# Patient Record
Sex: Female | Born: 1980 | Race: White | Hispanic: No | Marital: Single | Smoking: Current every day smoker
Health system: Southern US, Community
[De-identification: ages and names within clinical notes are randomized; demographics above are authoritative.]

## PROBLEM LIST (undated history)

## (undated) DIAGNOSIS — K5190 Ulcerative colitis, unspecified, without complications: Secondary | ICD-10-CM

## (undated) DIAGNOSIS — G4700 Insomnia, unspecified: Secondary | ICD-10-CM

## (undated) DIAGNOSIS — A0472 Enterocolitis due to Clostridium difficile, not specified as recurrent: Secondary | ICD-10-CM

## (undated) DIAGNOSIS — F319 Bipolar disorder, unspecified: Secondary | ICD-10-CM

## (undated) HISTORY — PX: BREAST ENHANCEMENT SURGERY: SHX7

## (undated) HISTORY — PX: DILATION AND CURETTAGE OF UTERUS: SHX78

---

## 2018-10-15 ENCOUNTER — Emergency Department (HOSPITAL_COMMUNITY): Payer: Self-pay

## 2018-10-15 ENCOUNTER — Other Ambulatory Visit: Payer: Self-pay

## 2018-10-15 ENCOUNTER — Observation Stay (HOSPITAL_COMMUNITY)
Admission: EM | Admit: 2018-10-15 | Discharge: 2018-10-15 | Disposition: A | Discharge status: Home / Self Care | Payer: Self-pay | Attending: Internal Medicine | Admitting: Internal Medicine

## 2018-10-15 ENCOUNTER — Encounter (HOSPITAL_COMMUNITY): Payer: Self-pay | Admitting: Emergency Medicine

## 2018-10-15 DIAGNOSIS — E860 Dehydration: Secondary | ICD-10-CM

## 2018-10-15 DIAGNOSIS — K5190 Ulcerative colitis, unspecified, without complications: Secondary | ICD-10-CM | POA: Insufficient documentation

## 2018-10-15 DIAGNOSIS — F4310 Post-traumatic stress disorder, unspecified: Secondary | ICD-10-CM | POA: Insufficient documentation

## 2018-10-15 DIAGNOSIS — F17210 Nicotine dependence, cigarettes, uncomplicated: Secondary | ICD-10-CM | POA: Insufficient documentation

## 2018-10-15 DIAGNOSIS — A0819 Acute gastroenteropathy due to other small round viruses: Principal | ICD-10-CM | POA: Insufficient documentation

## 2018-10-15 DIAGNOSIS — F411 Generalized anxiety disorder: Secondary | ICD-10-CM | POA: Insufficient documentation

## 2018-10-15 DIAGNOSIS — R059 Cough, unspecified: Secondary | ICD-10-CM

## 2018-10-15 DIAGNOSIS — R05 Cough: Secondary | ICD-10-CM

## 2018-10-15 DIAGNOSIS — F515 Nightmare disorder: Secondary | ICD-10-CM

## 2018-10-15 DIAGNOSIS — A0472 Enterocolitis due to Clostridium difficile, not specified as recurrent: Secondary | ICD-10-CM | POA: Insufficient documentation

## 2018-10-15 DIAGNOSIS — R000 Tachycardia, unspecified: Secondary | ICD-10-CM | POA: Insufficient documentation

## 2018-10-15 DIAGNOSIS — F319 Bipolar disorder, unspecified: Secondary | ICD-10-CM | POA: Insufficient documentation

## 2018-10-15 DIAGNOSIS — R112 Nausea with vomiting, unspecified: Secondary | ICD-10-CM

## 2018-10-15 DIAGNOSIS — R197 Diarrhea, unspecified: Secondary | ICD-10-CM | POA: Diagnosis present

## 2018-10-15 DIAGNOSIS — A0811 Acute gastroenteropathy due to Norwalk agent: Secondary | ICD-10-CM

## 2018-10-15 DIAGNOSIS — Z79899 Other long term (current) drug therapy: Secondary | ICD-10-CM

## 2018-10-15 HISTORY — DX: Enterocolitis due to Clostridium difficile, not specified as recurrent: A04.72

## 2018-10-15 HISTORY — DX: Ulcerative colitis, unspecified, without complications: K51.90

## 2018-10-15 LAB — COMPREHENSIVE METABOLIC PANEL
ALT: 18 U/L (ref 0–44)
AST: 19 U/L (ref 15–41)
Albumin: 3.9 g/dL (ref 3.5–5.0)
Alkaline Phosphatase: 68 U/L (ref 38–126)
Anion gap: 10 (ref 5–15)
BUN: 15 mg/dL (ref 6–20)
CO2: 20 mmol/L — ABNORMAL LOW (ref 22–32)
CREATININE: 0.81 mg/dL (ref 0.44–1.00)
Calcium: 9 mg/dL (ref 8.9–10.3)
Chloride: 110 mmol/L (ref 98–111)
GFR calc Af Amer: >60 mL/min (ref >60)
GFR calc non Af Amer: >60 mL/min (ref >60)
Glucose, Bld: 130 mg/dL — ABNORMAL HIGH (ref 70–99)
POTASSIUM: 4.2 mmol/L (ref 3.5–5.1)
Sodium: 140 mmol/L (ref 135–145)
Total Bilirubin: 0.6 mg/dL (ref 0.3–1.2)
Total Protein: 7.3 g/dL (ref 6.5–8.1)

## 2018-10-15 LAB — GASTROINTESTINAL PANEL BY PCR, STOOL (REPLACES STOOL CULTURE)
ADENOVIRUS F40/41: NOT DETECTED
Astrovirus: NOT DETECTED
CRYPTOSPORIDIUM: NOT DETECTED
Campylobacter species: NOT DETECTED
Cyclospora cayetanensis: NOT DETECTED
Entamoeba histolytica: NOT DETECTED
Enteroaggregative E coli (EAEC): NOT DETECTED
Enteropathogenic E coli (EPEC): NOT DETECTED
Enterotoxigenic E coli (ETEC): NOT DETECTED
Giardia lamblia: NOT DETECTED
Norovirus GI/GII: DETECTED — AB
Plesimonas shigelloides: NOT DETECTED
Rotavirus A: NOT DETECTED
Salmonella species: NOT DETECTED
Sapovirus (I, II, IV, and V): NOT DETECTED
Shiga like toxin producing E coli (STEC): NOT DETECTED
Shigella/Enteroinvasive E coli (EIEC): NOT DETECTED
Vibrio cholerae: NOT DETECTED
Vibrio species: NOT DETECTED
YERSINIA ENTEROCOLITICA: NOT DETECTED

## 2018-10-15 LAB — CBC
HCT: 46.7 % — ABNORMAL HIGH (ref 36.0–46.0)
Hemoglobin: 14.9 g/dL (ref 12.0–15.0)
MCH: 28.6 pg (ref 26.0–34.0)
MCHC: 31.9 g/dL (ref 30.0–36.0)
MCV: 89.6 fL (ref 80.0–100.0)
Platelets: 355 10*3/uL (ref 150–400)
RBC: 5.21 MIL/uL — ABNORMAL HIGH (ref 3.87–5.11)
RDW: 12 % (ref 11.5–15.5)
WBC: 17.1 10*3/uL — ABNORMAL HIGH (ref 4.0–10.5)
nRBC: 0 % (ref 0.0–0.2)

## 2018-10-15 LAB — C DIFFICILE QUICK SCREEN W PCR REFLEX
C Diff antigen: NEGATIVE
C Diff interpretation: NOT DETECTED
C Diff toxin: NEGATIVE

## 2018-10-15 LAB — URINALYSIS, ROUTINE W REFLEX MICROSCOPIC
Bilirubin Urine: NEGATIVE
Glucose, UA: NEGATIVE mg/dL
Hgb urine dipstick: NEGATIVE
Ketones, ur: NEGATIVE mg/dL
Leukocytes, UA: NEGATIVE
NITRITE: NEGATIVE
Protein, ur: NEGATIVE mg/dL
Specific Gravity, Urine: 1.023 (ref 1.005–1.030)
pH: 6 (ref 5.0–8.0)

## 2018-10-15 LAB — LIPASE, BLOOD: Lipase: 28 U/L (ref 11–51)

## 2018-10-15 LAB — I-STAT BETA HCG BLOOD, ED (MC, WL, AP ONLY): I-stat hCG, quantitative: <5 m[IU]/mL (ref <5)

## 2018-10-15 MED ORDER — IBUPROFEN 800 MG PO TABS
800.0000 mg | ORAL_TABLET | Freq: Four times a day (QID) | ORAL | Status: DC | PRN
  Administered 2018-10-15: 800 mg via ORAL
  Filled 2018-10-15: qty 1

## 2018-10-15 MED ORDER — LURASIDONE HCL 40 MG PO TABS
80.0000 mg | ORAL_TABLET | Freq: Every day | ORAL | Status: DC
  Filled 2018-10-15: qty 2

## 2018-10-15 MED ORDER — MORPHINE SULFATE (PF) 4 MG/ML IV SOLN
4.0000 mg | Freq: Once | INTRAVENOUS | Status: AC
  Administered 2018-10-15: 4 mg via INTRAVENOUS
  Filled 2018-10-15: qty 1

## 2018-10-15 MED ORDER — BUSPIRONE HCL 10 MG PO TABS
20.0000 mg | ORAL_TABLET | Freq: Three times a day (TID) | ORAL | Status: DC
  Administered 2018-10-15: 20 mg via ORAL
  Filled 2018-10-15: qty 2

## 2018-10-15 MED ORDER — ONDANSETRON 4 MG PO TBDP
4.0000 mg | ORAL_TABLET | Freq: Once | ORAL | Status: AC
  Administered 2018-10-15: 4 mg via ORAL
  Filled 2018-10-15: qty 1

## 2018-10-15 MED ORDER — ENOXAPARIN SODIUM 40 MG/0.4ML ~~LOC~~ SOLN
40.0000 mg | SUBCUTANEOUS | Status: DC
  Filled 2018-10-15: qty 0.4

## 2018-10-15 MED ORDER — ACETAMINOPHEN 500 MG PO TABS: 1000.0000 mg | ORAL_TABLET | Freq: Four times a day (QID) | ORAL | Status: DC | PRN

## 2018-10-15 MED ORDER — PRAZOSIN HCL 2 MG PO CAPS: 5.0000 mg | ORAL_CAPSULE | Freq: Every day | ORAL | Status: DC

## 2018-10-15 MED ORDER — IOHEXOL 300 MG/ML  SOLN: 30.0000 mL | Freq: Once | INTRAMUSCULAR | Status: DC | PRN

## 2018-10-15 MED ORDER — SODIUM CHLORIDE 0.9 % IV BOLUS
1000.0000 mL | Freq: Once | INTRAVENOUS | Status: AC
  Administered 2018-10-15: 1000 mL via INTRAVENOUS

## 2018-10-15 MED ORDER — IOHEXOL 300 MG/ML  SOLN
100.0000 mL | Freq: Once | INTRAMUSCULAR | Status: AC | PRN
  Administered 2018-10-15: 100 mL via INTRAVENOUS

## 2018-10-15 MED ORDER — LAMOTRIGINE 100 MG PO TABS
200.0000 mg | ORAL_TABLET | Freq: Every day | ORAL | Status: DC
  Administered 2018-10-15: 200 mg via ORAL
  Filled 2018-10-15: qty 2

## 2018-10-15 MED ORDER — ONDANSETRON 4 MG PO TBDP: 4.0000 mg | ORAL_TABLET | Freq: Three times a day (TID) | ORAL | 0 refills | Status: DC | PRN

## 2018-10-15 MED ORDER — ONDANSETRON 4 MG PO TBDP: 4.0000 mg | ORAL_TABLET | Freq: Three times a day (TID) | ORAL | Status: DC | PRN

## 2018-10-15 NOTE — ED Provider Notes (Signed)
[facility] EMERGENCY DEPARTMENT Provider Note   CSN: [id number] Arrival date & time: 10/15/18  0100     History   Chief Complaint Chief Complaint  Patient presents with  . Emesis  . Diarrhea    HPI Rebekah Cunningham is a 37 y.o. female.  Patient with a history of UC, C diff, presents with onset tonight of nonbloody diarrhea x 7 episodes, and vomiting 7+ episodes tonight. She reports intermittent abdominal pain. She has felt flushed but denies fever. No hematemesis. She states the symptoms feel like previous Cdiff episodes. No ulcerative colitis symptoms in 5 years since receiving "chemo and Remicade" in [place] prior to moving to [place].   The history is provided by the patient. No language interpreter was used.  Emesis   Associated symptoms include abdominal pain, chills and diarrhea. Pertinent negatives include no fever.  Diarrhea   Associated symptoms include abdominal pain, vomiting and chills.    Past Medical History:  Diagnosis Date  . C. difficile colitis   . Ulcerative colitis (HCC)     There are no active problems to display for this patient.   History reviewed. No pertinent surgical history.   OB History   No obstetric history on file.      Home Medications    Prior to Admission medications   Not on File    Family History No family history on file.  Social History Social History   Tobacco Use  . Smoking status: Current Every Day Smoker  . Smokeless tobacco: Never Used  Substance Use Topics  . Alcohol use: Yes  . Drug use: Never     Allergies   Patient has no known allergies.   Review of Systems Review of Systems  Constitutional: Positive for chills. Negative for fever.  Respiratory: Negative.  Negative for shortness of breath.   Cardiovascular: Negative.  Negative for chest pain.  Gastrointestinal: Positive for abdominal pain, diarrhea and vomiting.  Genitourinary: Negative for dysuria.  Musculoskeletal: Negative.    Skin: Negative.   Neurological: Negative.      Physical Exam Updated Vital Signs BP 123/88 (BP Location: Left Arm)   Pulse (!) 113   Temp 97.8 F (36.6 C) (Oral)   Resp 18   SpO2 97%   Physical Exam Constitutional:      General: She is not in acute distress.    Appearance: She is well-developed.  HENT:     Head: Normocephalic.  Neck:     Musculoskeletal: Normal range of motion and neck supple.  Cardiovascular:     Rate and Rhythm: Normal rate and regular rhythm.     Heart sounds: No murmur.  Pulmonary:     Effort: Pulmonary effort is normal.     Breath sounds: Normal breath sounds. No wheezing, rhonchi or rales.  Abdominal:     General: Bowel sounds are normal.     Palpations: Abdomen is soft.     Tenderness: There is no abdominal tenderness. There is no guarding or rebound.  Musculoskeletal: Normal range of motion.  Skin:    General: Skin is warm and dry.     Findings: No rash.  Neurological:     Mental Status: She is alert and oriented to person, place, and time.      ED Treatments / Results  Labs (all labs ordered are listed, but only abnormal results are displayed) Labs Reviewed  COMPREHENSIVE METABOLIC PANEL - Abnormal; Notable for the following components:      Result Value  CO2 20 (*)    Glucose, Bld 130 (*)    All other components within normal limits  CBC - Abnormal; Notable for the following components:   WBC 17.1 (*)    RBC 5.21 (*)    HCT 46.7 (*)    All other components within normal limits  C DIFFICILE QUICK SCREEN W PCR REFLEX  GASTROINTESTINAL PANEL BY PCR, STOOL (REPLACES STOOL CULTURE)  LIPASE, BLOOD  URINALYSIS, ROUTINE W REFLEX MICROSCOPIC  I-STAT BETA HCG BLOOD, ED ([facility], [facility], [facility] ONLY)    EKG None  Radiology No results found.  Procedures Procedures (including critical care time)  Medications Ordered in ED Medications - No data to display   Initial Impression / Assessment and Plan / ED Course  I have reviewed the  triage vital signs and the nursing notes.  Pertinent labs & imaging results that were available during my care of the patient were reviewed by me and considered in my medical decision making (see chart for details).     Patient to ED with intermittent abdominal pain, watery/nonbloody diarrhea, vomiting that started last night. No fever, however, she reports chills. History of C diff with similar symptoms. Also h/o UC without flare in 5 years.   On exam, she has a benign abdomen without tenderness. Obese, soft. She has a leukocytosis of 17, prompting CT scan evaluation which shows:  1. Fluid-filled tubular structure in the right lower quadrant arising from the cecum measures 3.1 cm in diameter. Mild associated wall thickening and enhancement, but only minimal edema tracking centrally. 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 Meckel's diverticulum is also considered but felt less likely. Recommend surgical consultation.  This was discussed with Dr. Donell BeersByerly, surgeon, who advised that the day team would be in to see the patient and provide consultation and recommendation.   Patient care signed out to Huntley DecAbi Harris, PA-C, pending surgical consult.   Final Clinical Impressions(s) / ED Diagnoses   Final diagnoses:  None   1. Diarrhea 2. Nausea and vomiting 3. Abdominal pain  ED Discharge Orders    None       Elpidio AnisUpstill, Yutaka Holberg, PA-C 10/15/18 0645    Glynn Octaveancour, Stephen, MD 10/15/18 [phone]

## 2018-10-15 NOTE — Discharge Summary (Signed)
Name: Rebekah Cunningham MRN: [id number] DOB: 1981-03-16 37 y.o. PCP: No primary care provider on file.  Date of Admission: 10/15/2018  1:00 AM Date of Discharge:  Attending Physician: Inez CatalinaMullen, Emily B, MD  Discharge Diagnosis: 1. Norovirus   Discharge Medications: Allergies as of 10/15/2018   No Known Allergies     Medication List    TAKE these medications   busPIRone 10 MG tablet Commonly known as:  BUSPAR Take 20 mg by mouth 3 (three) times daily.   lamoTRIgine 200 MG tablet Commonly known as:  LAMICTAL Take 200 mg by mouth daily.   lurasidone 80 MG Tabs tablet Commonly known as:  LATUDA Take 80 mg by mouth daily with breakfast.   MINIPRESS 5 MG capsule Generic drug:  prazosin Take 5 mg by mouth at bedtime.   ondansetron 4 MG disintegrating tablet Commonly known as:  ZOFRAN-ODT Take 1 tablet (4 mg total) by mouth every 8 (eight) hours as needed for nausea or vomiting.       Disposition and follow-up:   [name] [facility] St[facility].  At the hospital follow up visit please address:  1.  Norovirus - please confirm symptoms and leukocytosis have resolved   2.  Labs / imaging needed at time of follow-up: CBC with diff  3.  Pending labs/ test needing follow-up: none  Follow-up Appointments: Follow-up Information    [facility] Follow up in 1 week(s).   Contact information: [address] [place] [id number] [phone]          Hospital Course by problem list: 1. Norovirus  Ms. Rebekah Cunningham presented with one day of watery diarrhea and vomiting GI pathogen panel testing revealed norovirus and CBC was remarkable for a leukocyte count of 17. She described her son being sick with a fever and being sent home from school on the day of her presentation. CT scan of the abdomen revealed an inflammed appearance of the appendix and colon, general surgery was  consulted and felt her presentation was not consistent with the need for appendectomy. After the ED workup was complete she requested to be discharged. She was counseled on hand hygiene and provided an Rx for zofran. She was asked to follow up at urgent care or the ED if her symptoms returned.   Discharge Vitals:   BP (!) 89/55 (BP Location: Right Arm)   Pulse 100   Temp 98.4 F (36.9 C) (Oral)   Resp 16   SpO2 99%   Pertinent Labs, Studies, and Procedures:  GI pathogen panel - positive for norovirus  CT scan of the abdomen  IMPRESSION: 1. Fluid-filled tubular structure in the right lower quadrant arising from the cecum measures 3.1 cm in diameter. Mild associated wall thickening and enhancement, but only minimal edema tracking centrally. 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 Meckel's diverticulum is also considered but felt less likely. Recommend surgical consultation. 2. Submucosal fatty infiltration of the colon consistent with chronic/prior inflammation. No acute inflammatory change.  Discharge Instructions: Discharge Instructions    Call MD for:  extreme fatigue   Complete by:  As directed    Call MD for:  persistant dizziness or light-headedness   Complete by:  As directed    Call MD for:  persistant nausea and vomiting   Complete by:  As directed    Call MD for:  severe uncontrolled pain   Complete by:  As  directed    Call MD for:  temperature >100.4   Complete by:  As directed    Diet - low sodium heart healthy   Complete by:  As directed    Increase activity slowly   Complete by:  As directed       Signed: Eulah Pont, MD 10/15/2018, 1:46 PM   Pager: [phone]

## 2018-10-15 NOTE — ED Notes (Signed)
Pty discharged to family with steady ngait and printed instructions.

## 2018-10-15 NOTE — H&P (Signed)
Date: 10/15/2018               Patient Name:  Rebekah Cunningham MRN: [id number]  DOB: May 10, 1981 Age / Sex: 37 y.o., female   PCP: No primary care provider on file.         Medical Service: Internal Medicine Teaching Service         Attending Physician: Dr. Inez CatalinaMullen, Emily B, MD    First Contact: Dr. Petra KubaVogel Pager: [phone]  Second Contact: Dr. Delma Officerhundi Pager: [phone]       After Hours (After 5p/  First Contact Pager: [phone]  weekends / holidays): Second Contact Pager: [phone]   Chief Complaint: watery diarrhea, nausea, vomiting, chills   History of Present Illness:  Rebekah Cunningham is a 37 year old woman with PTSD with nightmares, generalized anxiety, bipolar depression, ulcerative colitis. She describes eating a lot of fast food over the weekend. Yesterday she developed crampy abdominal pain, nausea, vomiting and diarrhea. She had greater than 10 watery bowel movements with mucus, the last bowel movement was earlier this morning and she submit a sample for C. difficile testing. She has greater than 8 episodes of vomit, at first it was food contents then it progressed to bilious. She denies myalgias. She has had sick contacts, her son had a fever today and was sent home from school. She has a non productive cough which began today, she also has chills.  The diarrhea and vomiting resolved upon presentation to the ED.  She did receive her flu vaccine this year. She has never had symptoms like this in the past. She describes a history [place] age 37 [place], she had remicade and humira last about 5 years ago and has had no flairs since that time.   Meds:  Current Meds  Medication Sig  . busPIRone (BUSPAR) 10 MG tablet Take 20 mg by mouth 3 (three) times daily.  Marland Kitchen. lamoTRIgine (LAMICTAL) 200 MG tablet Take 200 mg by mouth daily.  Marland Kitchen. lurasidone (LATUDA) 80 MG TABS tablet Take 80 mg by mouth daily with breakfast.  . prazosin (MINIPRESS) 5 MG capsule Take 5 mg by mouth at bedtime.      Allergies: Allergies as of 10/15/2018  . (No Known Allergies)   Past Medical History:  Diagnosis Date  . C. difficile colitis   . Ulcerative colitis (HCC)     Family History:  Ulcerative colitis - mother, paternal grandfather  ETOH and polysubstance addiction   Social History: She is born [place] raised [place] She is a housewife. She has one son and has adopted her niece. She moved to [place] in 2017, she was living in [place] but moved after her sister and mother were murdered. She smokes cigarettes since age 37, was smoking 1 PPD at most but has reduced to 1/3 PPD. 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   Review of Systems: A complete ROS was negative except as per HPI.   Physical Exam: Blood pressure 116/79, pulse (!) 104, temperature 98.4 F (36.9 C), temperature source Oral, resp. rate 18, SpO2 95 %. General: uncomfortable appearing, no acute distress HEENT: Dry mucous membranes Cardiac: Distant heart sounds, elevated rate, regular rhythm, no murmurs rubs or gallops, no peripheral edema Pulm: Normal work of breathing, rhonchi in the left upper lung fields GI: Bowel sounds are hyperactive, soft, nontender, nondistended Skin: no rashes over the exposed skin of the back, arms,  legs Extremities: Well perfused, no peripheral edema Psych: Normal affect and thought content  EKG: personally reviewed my interpretation is sinus rhythm, T wave inversions in V1 and V2  Assessment & Plan by Problem: Active Problems:   Acute diarrhea  Gastroenteritis Rebekah Cunningham is a 37 year old woman with PTSD with nightmares, generalized anxiety, bipolar depression, ulcerative colitis. She was brought to the emergency department by a friend this morning for symptoms of watery diarrhea, nausea, and vomiting and crampy abdominal pain which began yesterday. At presentation she is mildly tachycardic, afebrile, normotensive,. Initial  labwork was significant for leukocytosis with WBC 17, she has normal renal function. CT scan revealed a Fluid-filled tubular structure in the right lower quadrant arising from the cecum, surgery was consulted for recommendations and felt that her presentation was not consistent with appendicitis. C.diff testing was negative for antigen and toxin. The CT scan also showed submucosal fat depositions suggestive of history of colitis. GI symptoms in the setting of URI symptoms are most consistent with viral etiology. We will test for Flu and continue supportive measures.  - follow up GI pathogen panel  - tylenol, ibuprofen and zofran for symptoms management   PTSD with nightmares  Generalized Anxiety  Bipolar Depression  Symptoms started after the traumatic loss of her mother and sister. Patient describes these as being stable at this time.  - continue home buspar 20 mg TID, lurasidone 80 mg qd, Prazosin, Lamotrigine   Dispo: Admit patient to Observation with expected length of stay less than 2 midnights.  Signed: Eulah Pont, MD 10/15/2018, 8:51 AM  Pager: [phone]

## 2018-10-15 NOTE — Consult Note (Signed)
Amel Kitch May 29, 1981  [id number].    Requesting MD: Dr. Glynn Octave Chief Complaint/Reason for Consult: N/V/D  HPI:  This is a 37 yo white female with a history of UC and multiple episodes of C diff colitis who moved from [place] in 2017 after her mother and sister were murdered.  She was treated for her UC about 5 years ago with "chemotherapy, Remicade, and then Humira injections."  She has not taken any other medication for her UC since that time with no issues.  She has had multiple episodes of C diff apparently during that time, she states secondary to her UC.  She was treated with abx therapy.  Yesterday she ate at [facility] and then several hours later developed N/V/D.  This was all nonbloody.  Her diarrhea was described as mucous and yellow in color.  It was watery.  She felt like it was c/w her previous c diff infections.  She denies fevers.  She denies any abdominal pain except some soreness currently from vomiting so many times.  She presented to the ED because she was concerned she had c diff and was dehydrated.  She had a CT scan completed that revealed a fluid filled structure in the RLQ arising from the cecum that measures 3.1 cm in diameter with mild wall thickening, suspicious for appendix mucocele with mild acute inflammation.  There is also submucosal fatty infiltration of the colon c/w chronic/prior inflammation.  We have been asked to see the patient to evaluate for appendicitis.  ROS: ROS : Please see HPI, otherwise all other systems have been reviewed and are negative.  History reviewed. No pertinent family history.  Past Medical History:  Diagnosis Date  . C. difficile colitis   . Ulcerative colitis [facility])     Past Surgical History:  Procedure Laterality Date  . BREAST ENHANCEMENT SURGERY    . DILATION AND CURETTAGE OF UTERUS      Social History:  reports that she has been smoking. She has been smoking about 0.50 packs per day. She has never used  smokeless tobacco. She reports current alcohol use. She reports that she does not use drugs.  Allergies: No Known Allergies  (Not in a hospital admission)    Physical Exam: Blood pressure 116/79, pulse (!) 104, temperature 98.4 F (36.9 C), temperature source Oral, resp. rate 18, SpO2 95 %. General: pleasant, WD, WN white female who is laying in bed in NAD HEENT: head is normocephalic, atraumatic.  Sclera are noninjected.  PERRL.  Ears and nose without any masses or lesions.  Mouth is pink and dry. Heart: regular, rate, and rhythm.  Normal s1,s2. No obvious murmurs, gallops, or rubs noted.  Palpable radial and pedal pulses bilaterally Lungs: CTAB, no wheezes, rhonchi, or rales noted.  Respiratory effort nonlabored Abd: soft, essentially NT, ND, +BS, no masses, hernias, or organomegaly MS: all 4 extremities are symmetrical with no cyanosis, clubbing, or edema. Skin: warm and dry with no masses, lesions, or rashes Psych: A&Ox3 with an appropriate affect.   Results for orders placed or performed during the hospital encounter of 10/15/18 (from the past 48 hour(s))  Lipase, blood     Status: None   Collection Time: 10/15/18  1:09 AM  Result Value Ref Range   Lipase 28 11 - 51 U/L    Comment: Performed at [facility], [address], [place], [place] [address]  Comprehensive metabolic panel     Status: Abnormal  Collection Time: 10/15/18  1:09 AM  Result Value Ref Range   Sodium 140 135 - 145 mmol/L   Potassium 4.2 3.5 - 5.1 mmol/L   Chloride 110 98 - 111 mmol/L   CO2 20 (L) 22 - 32 mmol/L   Glucose, Bld 130 (H) 70 - 99 mg/dL   BUN 15 6 - 20 mg/dL   Creatinine, Ser 1.61 0.44 - 1.00 mg/dL   Calcium 9.0 8.9 - 09.6 mg/dL   Total Protein 7.3 6.5 - 8.1 g/dL   Albumin 3.9 3.5 - 5.0 g/dL   AST 19 15 - 41 U/L   ALT 18 0 - 44 U/L   Alkaline Phosphatase 68 38 - 126 U/L   Total Bilirubin 0.6 0.3 - 1.2 mg/dL   GFR calc non Af Amer >60 >60 mL/min   GFR calc Af Amer >60 >60 mL/min    Anion gap 10 5 - 15    Comment: Performed at [facility], [address] [place], [place] [address]  CBC     Status: Abnormal   Collection Time: 10/15/18  1:09 AM  Result Value Ref Range   WBC 17.1 (H) 4.0 - 10.5 K/uL   RBC 5.21 (H) 3.87 - 5.11 MIL/uL   Hemoglobin 14.9 12.0 - 15.0 g/dL   HCT 98.1 (H) 19.1 - 47.8 %   MCV 89.6 80.0 - 100.0 fL   MCH 28.6 26.0 - 34.0 pg   MCHC 31.9 30.0 - 36.0 g/dL   RDW 29.5 62.1 - 30.8 %   Platelets 355 150 - 400 K/uL   nRBC 0.0 0.0 - 0.2 %    Comment: Performed at [facility], [address], [place], [place] [address]  I-Stat beta hCG blood, ED     Status: None   Collection Time: 10/15/18  1:13 AM  Result Value Ref Range   I-stat hCG, quantitative <5.0 <5 mIU/mL   Comment 3            Comment:   GEST. AGE      CONC.  (mIU/mL)   <=1 WEEK        5 - 50     2 WEEKS       50 - 500     3 WEEKS       100 - 10,000     4 WEEKS     1,000 - 30,000        FEMALE AND NON-PREGNANT FEMALE:     LESS THAN 5 mIU/mL   C difficile quick scan w PCR reflex     Status: None   Collection Time: 10/15/18  3:57 AM  Result Value Ref Range   C Diff antigen NEGATIVE NEGATIVE   C Diff toxin NEGATIVE NEGATIVE   C Diff interpretation No C. difficile detected.   Urinalysis, Routine w reflex microscopic     Status: None   Collection Time: 10/15/18  4:02 AM  Result Value Ref Range   Color, Urine YELLOW YELLOW   APPearance CLEAR CLEAR   Specific Gravity, Urine 1.023 1.005 - 1.030   pH 6.0 5.0 - 8.0   Glucose, UA NEGATIVE NEGATIVE mg/dL   Hgb urine dipstick NEGATIVE NEGATIVE   Bilirubin Urine NEGATIVE NEGATIVE   Ketones, ur NEGATIVE NEGATIVE mg/dL   Protein, ur NEGATIVE NEGATIVE mg/dL   Nitrite NEGATIVE NEGATIVE   Leukocytes, UA NEGATIVE NEGATIVE    Comment: Performed at [facility], [address] [place], [place] [address]  Ct Abdomen Pelvis W Contrast  Result Date: 10/15/2018 CLINICAL DATA:  Acute abdominal pain.  Nausea, vomiting,  diarrhea. EXAM: CT ABDOMEN AND PELVIS WITH CONTRAST TECHNIQUE: Multidetector CT imaging of the abdomen and pelvis was performed using the standard protocol following bolus administration of intravenous contrast. CONTRAST:  OMNIPAQUE IOHEXOL 300 MG/ML  SOLN COMPARISON:  None. FINDINGS: Lower chest: The lung bases are clear. Hepatobiliary: No focal liver abnormality is seen. No gallstones, gallbladder wall thickening, or biliary dilatation. Pancreas: No ductal dilatation or inflammation. Spleen: Normal in size without focal abnormality. Adrenals/Urinary Tract: Normal adrenal glands. No hydronephrosis or perinephric edema. Homogeneous renal enhancement. Excretion of contrast into both renal collecting systems limits assessment for renal stones. Small subcentimeter hypodensities in both kidneys are too small to characterize but likely small cysts. Urinary bladder is partially distended without wall thickening. Stomach/Bowel: Tubular structure arising from the cecum courses anterior inferiorly and contains central fluid/low-density, 3.1 cm in diameter. Mild peripheral enhancement and wall thickening. Inflammatory changes/edema tracks centrally. No internal calcifications or perforation. Findings are suspicious for appendix mucocele with mild inflammatory change, less likely Meckel's diverticulum. Submucosal fatty infiltration throughout the colon suggest prior inflammation. No acute colonic inflammatory change. Small bowel and stomach are unremarkable. Vascular/Lymphatic: Few prominent central mesenteric nodes are likely reactive. No ileocolic or right lower quadrant adenopathy. Abdominal aorta is normal in caliber. Reproductive: Uterus and bilateral adnexa are unremarkable. Normal appearing right ovary is in close proximity to the dilated tubular structure arising from the cecum. Other: No free air, free fluid, or intra-abdominal fluid collection. Small fat containing umbilical hernia. Musculoskeletal: Scattered  bone islands throughout the pelvis. There are no acute or suspicious osseous abnormalities. IMPRESSION: 1. Fluid-filled tubular structure in the right lower quadrant arising from the cecum measures 3.1 cm in diameter. Mild associated wall thickening and enhancement, but only minimal edema tracking centrally. 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 Meckel's diverticulum is also considered but felt less likely. Recommend surgical consultation. 2. Submucosal fatty infiltration of the colon consistent with chronic/prior inflammation. No acute inflammatory change. Electronically Signed   By: Narda Rutherford M.D.   On: 10/15/2018 06:21      Assessment/Plan Nausea, vomiting, and diarrhea, ? Gastroenteritis -The patient's story and PE are not consistent with appendicitis.  She is currently not having any RLQ abdominal pain.  She is slightly tender on each side, right and left gutters, which she states is soreness from vomiting.  Her C diff is negative.  She does have a history of UC which is seen on her CT scan but with no acute inflammatory changes noted.  ? GE after eating [employer] yesterday.  Appendiceal thickening, ? Mucocele -the patient does have a dilated appendix up to 3.1cm.  There is some thickening which is likely reactive to acute N/V/D.  I do not think she has acute appendicitis as her store is not c/w this and she has essentially no tenderness on her exam.  She may have a mucocele though.  This can be followed for now.  She does not require acute surgical intervention at this time.  She will likely need outpatient follow up to discuss possible elective appendectomy.  We will continue to follow.  If she acutely worsens or develops more symptoms c/w appendicitis then she may need surgery this admit.   FEN - currently NPO, ok for some liquids  VTE - per medicine, ok for chemical prophylaxis from our standpoint ID - per medicine, none needs  for her appendix    Letha CapeKelly E Dylann Gallier, [facility] 10/15/2018, 8:53 AM Pager: [phone]

## 2018-10-15 NOTE — ED Notes (Addendum)
Dr. Obie DredgeBlum contacted with PCR information.Aware that flu panel not done. States to continue dc.

## 2018-10-15 NOTE — ED Notes (Signed)
Pt is becoming tearful and upset.

## 2018-10-15 NOTE — ED Notes (Signed)
Pt state ashe unbderstands instructions for dc and hand wasdhing . Home with family steady ngait.

## 2018-10-15 NOTE — ED Notes (Signed)
Pt eating turkey tray and tolerating well. 

## 2018-10-15 NOTE — ED Notes (Signed)
Gave pt sprite and water per MD ok

## 2018-10-15 NOTE — ED Triage Notes (Signed)
Pt c/o nausea/vomiting/diarrhea x 2 days. Hx cdiff and UC

## 2018-10-15 NOTE — ED Notes (Signed)
Pt is requesting to be discharged. Dr. Obie DredgeBlum conmtacted.

## 2018-10-15 NOTE — Discharge Instructions (Signed)
Ms. Rebekah Cunningham, you were diagnosed with gastroenteritis from norovirus. Please practice good hand hygiene to protect your close contacts from exposure to this virus. Recommendations can be found on this CDC website.   CDC website: DumpClick.siwww.cdc.gov/norovirus

## 2018-10-22 ENCOUNTER — Ambulatory Visit: Payer: Self-pay | Admitting: Surgery

## 2018-10-22 NOTE — H&P (Signed)
Rebekah Cunningham Documented: 10/22/2018 2:48 PM Location: [facility] Patient #: [id number] DOB: 10-27-1981 Single / Language: [place] / Race: White Female  History of Present Illness Rebekah Cunningham(Danford Tat A. Maranatha Grossi MD; 10/22/2018 3:23 PM) Patient words: Patient returns for follow-up after being seen last week emergency room for diarrhea in which found normal bothersome culture. I CT scan was obtained due to her history of ulcerative colitis. A 3 cm mucocele was identified without inflammation. She was discharged from the returns are room with follow-up today to discuss appendectomy. She has a history of ulcerative colitis for the last 20 years or so which is been relatively stable. She denies abdominal pain nausea vomiting or diarrhea today.    CLINICAL DATA: Acute abdominal pain. Nausea, vomiting, diarrhea.  EXAM: CT ABDOMEN AND PELVIS WITH CONTRAST  TECHNIQUE: Multidetector CT imaging of the abdomen and pelvis was performed using the standard protocol following bolus administration of intravenous contrast.  CONTRAST: 100mL OMNIPAQUE IOHEXOL 300 MG/ML SOLN  COMPARISON: None.  FINDINGS: Lower chest: The lung bases are clear.  Hepatobiliary: No focal liver abnormality is seen. No gallstones, gallbladder wall thickening, or biliary dilatation.  Pancreas: No ductal dilatation or inflammation.  Spleen: Normal in size without focal abnormality.  Adrenals/Urinary Tract: Normal adrenal glands. No hydronephrosis or perinephric edema. Homogeneous renal enhancement. Excretion of contrast into both renal collecting systems limits assessment for renal stones. Small subcentimeter hypodensities in both kidneys are too small to characterize but likely small cysts. Urinary bladder is partially distended without wall thickening.  Stomach/Bowel: Tubular structure arising from the cecum courses anterior inferiorly and contains central fluid/low-density, 3.1 cm in diameter. Mild  peripheral enhancement and wall thickening. Inflammatory changes/edema tracks centrally. No internal calcifications or perforation. Findings are suspicious for appendix mucocele with mild inflammatory change, less likely Meckel's diverticulum. Submucosal fatty infiltration throughout the colon suggest prior inflammation. No acute colonic inflammatory change. Small bowel and stomach are unremarkable.  Vascular/Lymphatic: Few prominent central mesenteric nodes are likely reactive. No ileocolic or right lower quadrant adenopathy. Abdominal aorta is normal in caliber.  Reproductive: Uterus and bilateral adnexa are unremarkable. Normal appearing right ovary is in close proximity to the dilated tubular structure arising from the cecum.  Other: No free air, free fluid, or intra-abdominal fluid collection. Small fat containing umbilical hernia.  Musculoskeletal: Scattered bone islands throughout the pelvis. There are no acute or suspicious osseous abnormalities.  IMPRESSION: 1. Fluid-filled tubular structure in the right lower quadrant arising from the cecum measures 3.1 cm in diameter. Mild associated wall thickening and enhancement, but only minimal edema tracking centrally. 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 Meckel's diverticulum is also considered but felt less likely. Recommend surgical consultation. 2. Submucosal fatty infiltration of the colon consistent with chronic/prior inflammation. No acute inflammatory change.   Electronically Signed By: Narda RutherfordMelanie Sanford M.D. On: 10/15/2018 06:21.  The patient is a 37 year old female.   Past Surgical History Santiago Glad([place], [place]; 10/22/2018 2:48 PM) Breast Augmentation Bilateral. Oral Surgery  Diagnostic Studies History Santiago Glad[place]; 10/22/2018 2:48 PM) Colonoscopy 5-10 years ago Mammogram 1-3 years ago Pap Smear >5 years ago  Allergies Santiago Glad(Kelsey Phillips, CMA; 10/22/2018  2:51 PM) No Known Drug Allergies [10/22/2018]: Allergies Reconciled  Medication History Santiago Glad(Kelsey Phillips, CMA; 10/22/2018 2:52 PM) Propranolol HCl (10MG  Tablet, Oral) Active. Prazosin HCl (5MG  Capsule, Oral) Active. Latuda (80MG  Tablet, Oral) Active. lamoTRIgine (200MG  Tablet, Oral) Active. busPIRone HCl (30MG  Tablet, Oral) Active. Medications Reconciled  Social History Santiago Glad([place]; 10/22/2018  2:48 PM) Caffeine use Coffee. Illicit drug use Prefer to discuss with provider. No alcohol use Tobacco use Current every day smoker.  Family History Santiago Glad([place]; 10/22/2018 2:48 PM) Alcohol Abuse Father. Bleeding disorder Mother. Colon Polyps Mother. Depression Father, Mother, Sister. Hypertension Sister. Ischemic Bowel Disease Mother.  Pregnancy / Birth History Santiago Glad([place]; 10/22/2018 2:48 PM) Age at menarche 14 years. Contraceptive History Contraceptive implant. Gravida 3 Irregular periods Maternal age 36-20 Para 1  Other Problems Santiago Glad([place]; 10/22/2018 2:48 PM) Anxiety Disorder Depression Gastric Ulcer Kidney Stone Migraine Headache Pancreatitis Ulcerative Colitis     Review of Systems Santiago Glad(Kelsey Phillips CMA; 10/22/2018 2:48 PM) General Present- Fatigue. Not Present- Appetite Loss, Chills, Fever, Night Sweats, Weight Gain and Weight Loss. Skin Not Present- Change in Wart/Mole, Dryness, Hives, Jaundice, New Lesions, Non-Healing Wounds, Rash and Ulcer. HEENT Not Present- Earache, Hearing Loss, Hoarseness, Nose Bleed, Oral Ulcers, Ringing in the Ears, Seasonal Allergies, Sinus Pain, Sore Throat, Visual Disturbances, Wears glasses/contact lenses and Yellow Eyes. Respiratory Present- Chronic Cough, Snoring and Wheezing. Not Present- Bloody sputum and Difficulty Breathing. Breast Not Present- Breast Mass, Breast Pain, Nipple Discharge and Skin Changes. Cardiovascular Not Present- Chest Pain, Difficulty  Breathing Lying Down, Leg Cramps, Palpitations, Rapid Heart Rate, Shortness of Breath and Swelling of Extremities. Gastrointestinal Present- Bloody Stool. Not Present- Abdominal Pain, Bloating, Change in Bowel Habits, Chronic diarrhea, Constipation, Difficulty Swallowing, Excessive gas, Gets full quickly at meals, Hemorrhoids, Indigestion, Nausea, Rectal Pain and Vomiting. Female Genitourinary Not Present- Frequency, Nocturia, Painful Urination, Pelvic Pain and Urgency. Musculoskeletal Not Present- Back Pain, Joint Pain, Joint Stiffness, Muscle Pain, Muscle Weakness and Swelling of Extremities. Neurological Not Present- Decreased Memory, Fainting, Headaches, Numbness, Seizures, Tingling, Tremor, Trouble walking and Weakness. Psychiatric Present- Bipolar and Change in Sleep Pattern. Not Present- Anxiety, Depression, Fearful and Frequent crying. Endocrine Not Present- Cold Intolerance, Excessive Hunger, Hair Changes, Heat Intolerance, Hot flashes and New Diabetes. Hematology Not Present- Blood Thinners, Easy Bruising, Excessive bleeding, Gland problems, HIV and Persistent Infections.  Vitals Santiago Glad(Kelsey Phillips CMA; 10/22/2018 2:51 PM) 10/22/2018 2:50 PM Weight: 217 lb Height: 69in Body Surface Area: 2.14 m Body Mass Index: 32.04 kg/m  BP: 110/70 (Sitting, Left Arm, Standard)      Physical Exam (Daegon Deiss A. Dennard Vezina MD; 10/22/2018 3:25 PM)  General Mental Status-Alert. General Appearance-Consistent with stated age. Hydration-Well hydrated. Voice-Normal.  Head and Neck Head-normocephalic, atraumatic with no lesions or palpable masses. Trachea-midline. Thyroid Gland Characteristics - normal size and consistency.  Chest and Lung Exam Chest and lung exam reveals -quiet, even and easy respiratory effort with no use of accessory muscles and on auscultation, normal breath sounds, no adventitious sounds and normal vocal resonance. Inspection Chest Wall - Normal. Back -  normal.  Cardiovascular Cardiovascular examination reveals -normal heart sounds, regular rate and rhythm with no murmurs and normal pedal pulses bilaterally.  Abdomen Inspection Inspection of the abdomen reveals - No Hernias. Skin - Scar - no surgical scars. Palpation/Percussion Palpation and Percussion of the abdomen reveal - Soft, Non Tender, No Rebound tenderness, No Rigidity (guarding) and No hepatosplenomegaly. Auscultation Auscultation of the abdomen reveals - Bowel sounds normal.  Neurologic Neurologic evaluation reveals -alert and oriented x 3 with no impairment of recent or remote memory. Mental Status-Normal.    Assessment & Plan (Allexus Ovens A. 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 Risks, benefits and long-term expectations and the need for open procedure discussed with the patient. There may be some benefit  from appendectomy with ulcerative colitis keeping this disease in remission for a longer period of time. Risk bleeding, infection, stump leak, abscess, bowel resection, and any further treatments and/or surgery and/or procedure. Risk of resection include bleeding, infection, leak of anastamosis, death, colostomy, organ injury, kidney injury, ureter injury, bladder injury, SBO, and need for other surgery. Pt agrees to proceed.  Current Plans You are being scheduled for surgery- Our schedulers will call you.  You should hear from our office's scheduling department within 5 working days about the location, date, and time of surgery. We try to make accommodations for patient's preferences in scheduling surgery, but sometimes the OR schedule or the surgeon's schedule prevents [place] from making those accommodations.  If you have not heard from our office [phone]) in 5 working days, call the office and ask for your surgeon's nurse.  If you have other questions about your diagnosis, plan, or surgery,  call the office and ask for your surgeon's nurse.  Written instructions provided Pt Education - [facility] HCI

## 2019-11-18 IMAGING — CT CT ABD-PELV W/ CM
2 of 4 series · 15 of 46 positions shown, 17 images · IV contrast (Omni 300)
Comparison: None.

CLINICAL DATA: Acute abdominal pain.  Nausea, vomiting, diarrhea.

EXAM:
CT ABDOMEN AND PELVIS WITH CONTRAST
TECHNIQUE: Multidetector CT imaging of the abdomen and pelvis was performed
using the standard protocol following bolus administration of
intravenous contrast.
CONTRAST:  100mL OMNIPAQUE IOHEXOL 300 MG/ML  SOLN

[Series 3: a/p w/ 5mm · axial · 0.97mm/px · z∈[+691,+1146]mm · 12 of 101 slices shown, 14 images]
[im 5/101  soft-tissue]
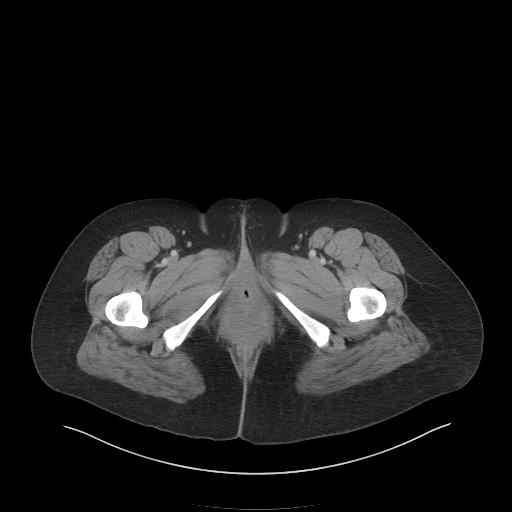
[im 5/101  bone]
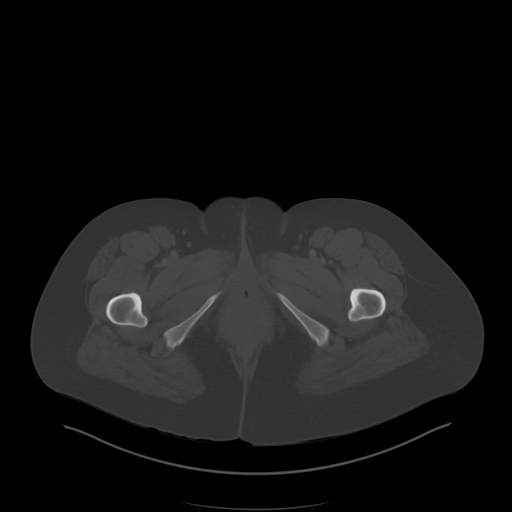
[im 14/101  soft-tissue]
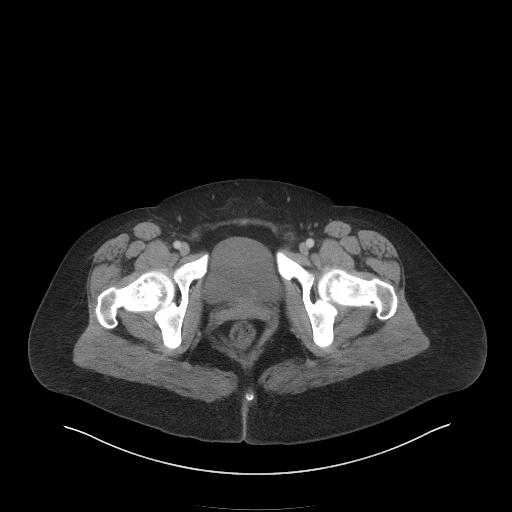
[im 22/101  soft-tissue]
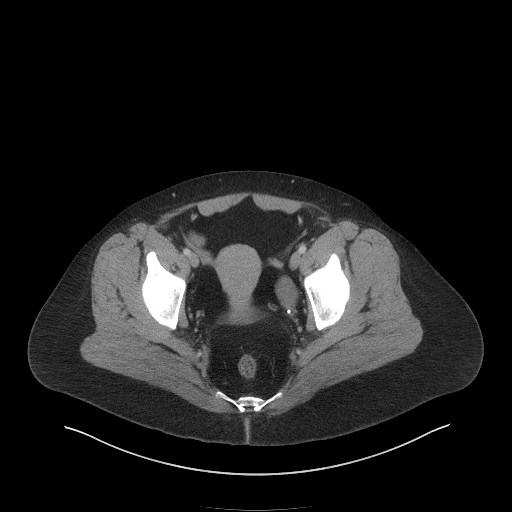
[im 31/101  soft-tissue]
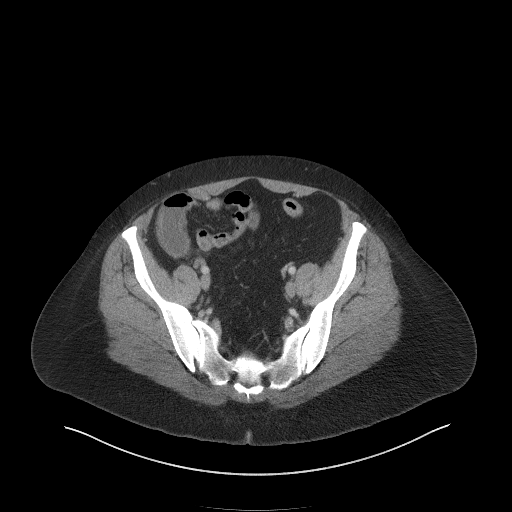
[im 40/101  soft-tissue]
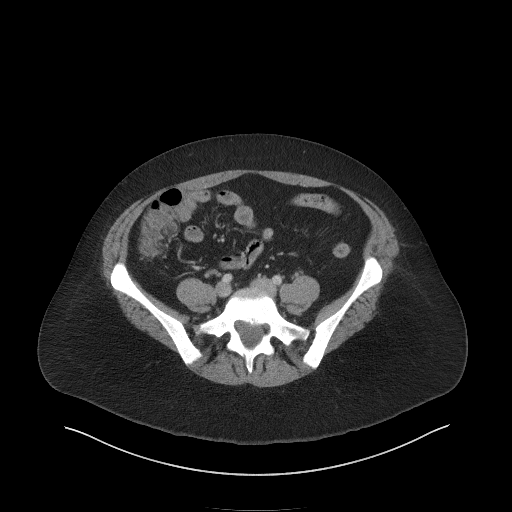
[im 48/101  soft-tissue]
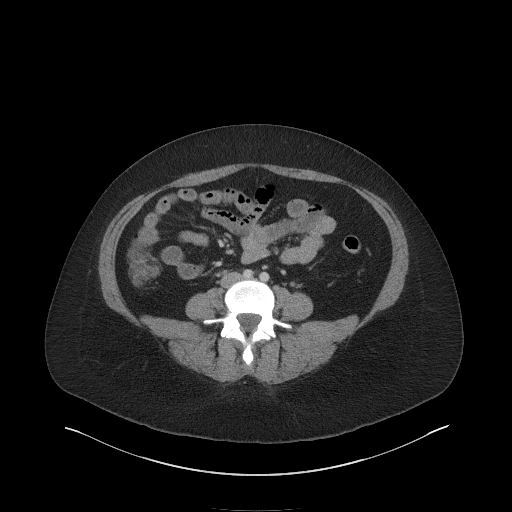
[im 53/101  soft-tissue]
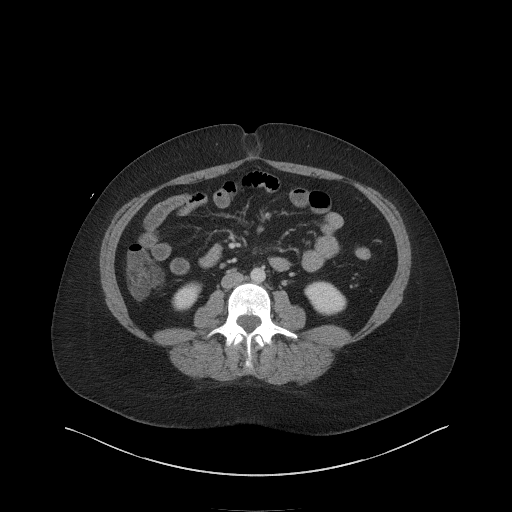
[im 61/101  soft-tissue]
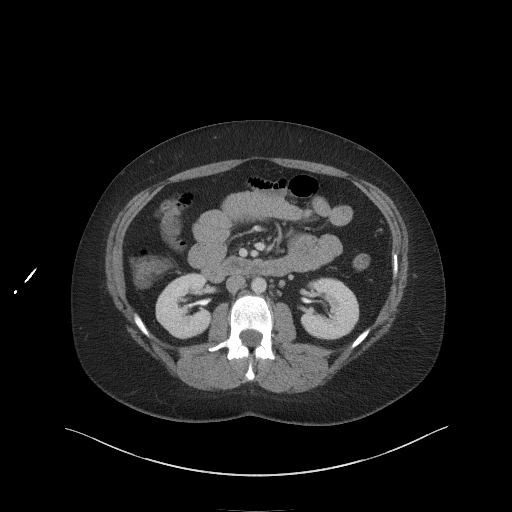
[im 70/101  soft-tissue]
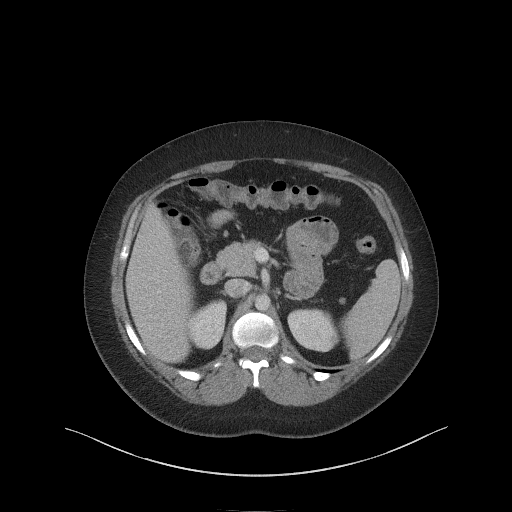
[im 70/101  bone]
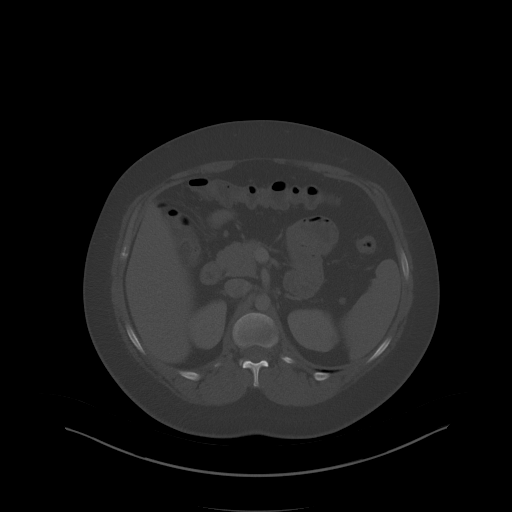
[im 79/101  soft-tissue]
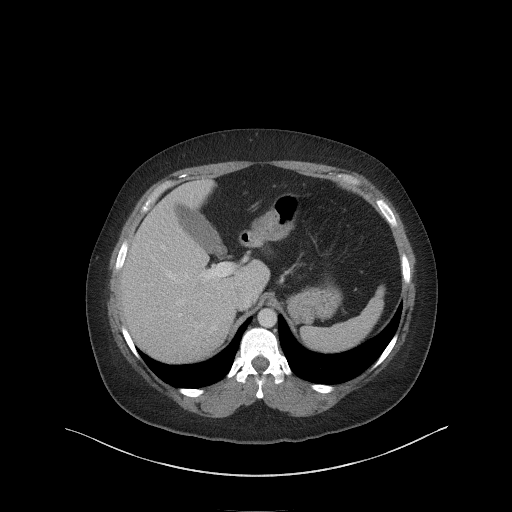
[im 87/101  soft-tissue]
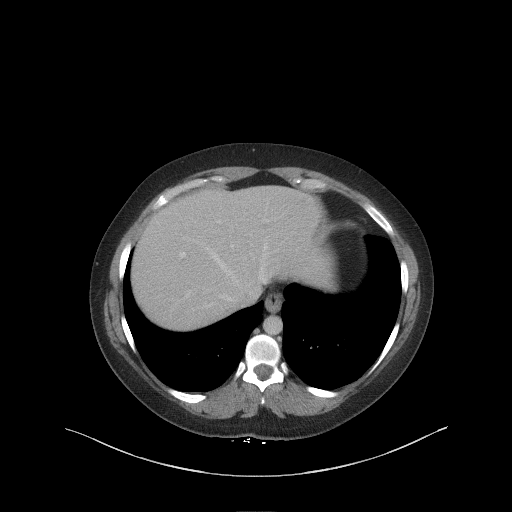
[im 96/101  soft-tissue]
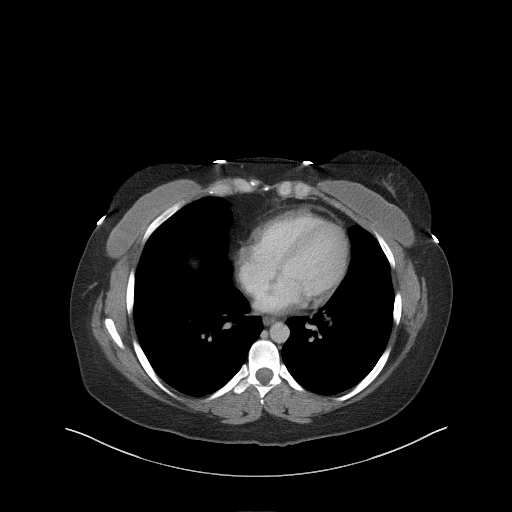

[Series 6: a/p w/ cor · coronal · 0.78mm/px · 3 of 142 slices shown]
[im 48/142  soft-tissue]
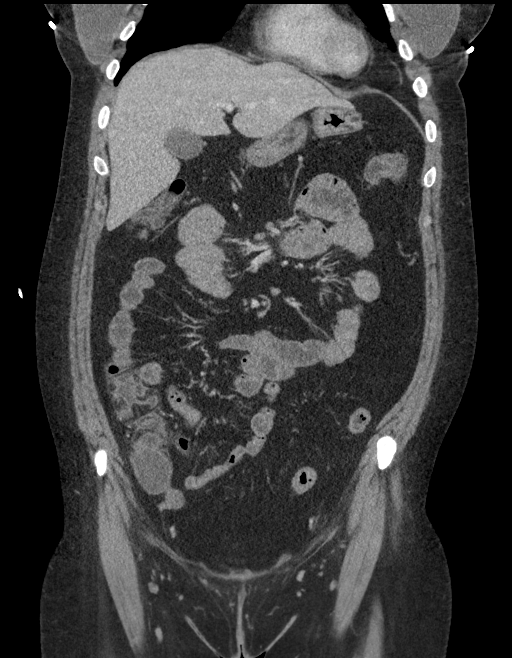
[im 63/142  soft-tissue]
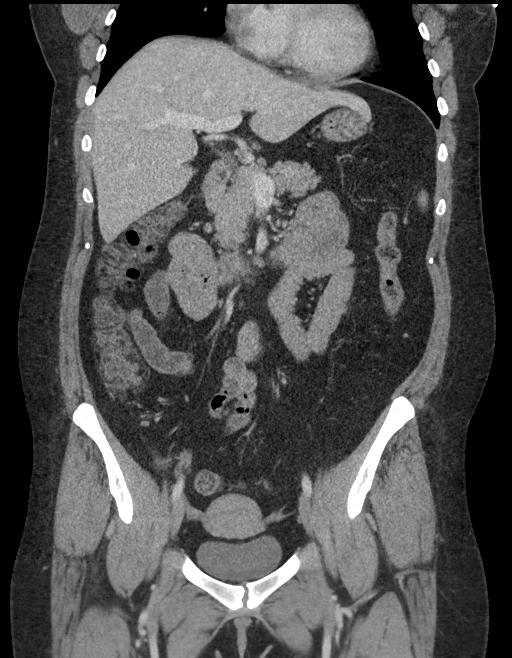
[im 79/142  soft-tissue]
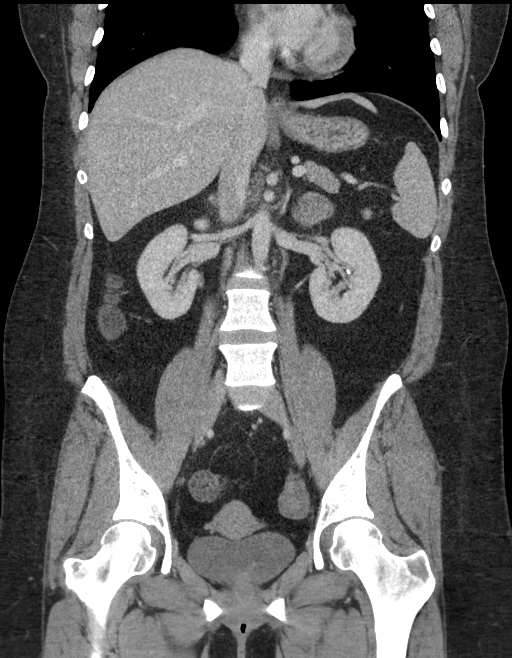

[15 of 46 positions shown; findings below may reference images not displayed]

FINDINGS: Lower chest: The lung bases are clear.

Hepatobiliary: No focal liver abnormality is seen. No gallstones,
gallbladder wall thickening, or biliary dilatation.

Pancreas: No ductal dilatation or inflammation.

Spleen: Normal in size without focal abnormality.

Adrenals/Urinary Tract: Normal adrenal glands. No hydronephrosis or
perinephric edema. Homogeneous renal enhancement. Excretion of
contrast into both renal collecting systems limits assessment for
renal stones. Small subcentimeter hypodensities in both kidneys are
too small to characterize but likely small cysts. Urinary bladder is
partially distended without wall thickening.

Stomach/Bowel: Tubular structure arising from the cecum courses
anterior inferiorly and contains central fluid/low-density, 3.1 cm
in diameter. Mild peripheral enhancement and wall thickening.
Inflammatory changes/edema tracks centrally. No internal
calcifications or perforation. Findings are suspicious for appendix
mucocele with mild inflammatory change, less likely Meckel's
diverticulum. Submucosal fatty infiltration throughout the colon
suggest prior inflammation. No acute colonic inflammatory change.
Small bowel and stomach are unremarkable.

Vascular/Lymphatic: Few prominent central mesenteric nodes are
likely reactive. No ileocolic or right lower quadrant adenopathy.
Abdominal aorta is normal in caliber.

Reproductive: Uterus and bilateral adnexa are unremarkable. Normal
appearing right ovary is in close proximity to the dilated tubular
structure arising from the cecum.

Other: No free air, free fluid, or intra-abdominal fluid collection.
Small fat containing umbilical hernia.

Musculoskeletal: Scattered bone islands throughout the pelvis. There
are no acute or suspicious osseous abnormalities.
IMPRESSION: 1. Fluid-filled tubular structure in the right lower quadrant
arising from the cecum measures 3.1 cm in diameter. Mild associated
wall thickening and enhancement, but only minimal edema tracking
centrally. Findings are suspicious for appendix mucocele with mild
acute inflammation. Meckel's diverticulum is also considered but
felt less likely. Recommend surgical consultation.
2. Submucosal fatty infiltration of the colon consistent with
chronic/prior inflammation. No acute inflammatory change.

## 2021-07-19 ENCOUNTER — Other Ambulatory Visit: Payer: Self-pay

## 2021-07-19 ENCOUNTER — Encounter (HOSPITAL_COMMUNITY): Payer: Self-pay

## 2021-07-19 ENCOUNTER — Emergency Department (HOSPITAL_COMMUNITY)
Admission: EM | Admit: 2021-07-19 | Discharge: 2021-07-20 | Disposition: A | Discharge status: Home / Self Care | Payer: No Typology Code available for payment source | Attending: Emergency Medicine | Admitting: Emergency Medicine

## 2021-07-19 DIAGNOSIS — Z046 Encounter for general psychiatric examination, requested by authority: Secondary | ICD-10-CM | POA: Insufficient documentation

## 2021-07-19 DIAGNOSIS — Z20822 Contact with and (suspected) exposure to covid-19: Secondary | ICD-10-CM | POA: Insufficient documentation

## 2021-07-19 DIAGNOSIS — F430 Acute stress reaction: Secondary | ICD-10-CM

## 2021-07-19 DIAGNOSIS — F319 Bipolar disorder, unspecified: Secondary | ICD-10-CM | POA: Diagnosis not present

## 2021-07-19 DIAGNOSIS — Z79899 Other long term (current) drug therapy: Secondary | ICD-10-CM | POA: Diagnosis not present

## 2021-07-19 DIAGNOSIS — F17210 Nicotine dependence, cigarettes, uncomplicated: Secondary | ICD-10-CM | POA: Diagnosis not present

## 2021-07-19 DIAGNOSIS — Y900 Blood alcohol level of less than 20 mg/100 ml: Secondary | ICD-10-CM | POA: Insufficient documentation

## 2021-07-19 HISTORY — DX: Bipolar disorder, unspecified: F31.9

## 2021-07-19 HISTORY — DX: Insomnia, unspecified: G47.00

## 2021-07-19 LAB — COMPREHENSIVE METABOLIC PANEL
ALT: 17 U/L (ref 0–44)
AST: 13 U/L — ABNORMAL LOW (ref 15–41)
Albumin: 4.2 g/dL (ref 3.5–5.0)
Alkaline Phosphatase: 75 U/L (ref 38–126)
Anion gap: 10 (ref 5–15)
BUN: 12 mg/dL (ref 6–20)
CO2: 20 mmol/L — ABNORMAL LOW (ref 22–32)
Calcium: 9.3 mg/dL (ref 8.9–10.3)
Chloride: 105 mmol/L (ref 98–111)
Creatinine, Ser: 0.67 mg/dL (ref 0.44–1.00)
GFR, Estimated: >60 mL/min (ref >60)
Glucose, Bld: 97 mg/dL (ref 70–99)
Potassium: 3.9 mmol/L (ref 3.5–5.1)
Sodium: 135 mmol/L (ref 135–145)
Total Bilirubin: 0.6 mg/dL (ref 0.3–1.2)
Total Protein: 7.3 g/dL (ref 6.5–8.1)

## 2021-07-19 LAB — RAPID URINE DRUG SCREEN, HOSP PERFORMED
Amphetamines: NOT DETECTED
Barbiturates: NOT DETECTED
Benzodiazepines: NOT DETECTED
Cocaine: NOT DETECTED
Opiates: NOT DETECTED
Tetrahydrocannabinol: NOT DETECTED

## 2021-07-19 LAB — CBC
HCT: 43.9 % (ref 36.0–46.0)
Hemoglobin: 14.8 g/dL (ref 12.0–15.0)
MCH: 30.1 pg (ref 26.0–34.0)
MCHC: 33.7 g/dL (ref 30.0–36.0)
MCV: 89.4 fL (ref 80.0–100.0)
Platelets: 351 10*3/uL (ref 150–400)
RBC: 4.91 MIL/uL (ref 3.87–5.11)
RDW: 11.9 % (ref 11.5–15.5)
WBC: 11.7 10*3/uL — ABNORMAL HIGH (ref 4.0–10.5)
nRBC: 0 % (ref 0.0–0.2)

## 2021-07-19 LAB — I-STAT BETA HCG BLOOD, ED (MC, WL, AP ONLY): I-stat hCG, quantitative: <5 m[IU]/mL (ref <5)

## 2021-07-19 LAB — SALICYLATE LEVEL: Salicylate Lvl: <7 mg/dL — ABNORMAL LOW (ref 7.0–30.0)

## 2021-07-19 LAB — ACETAMINOPHEN LEVEL: Acetaminophen (Tylenol), Serum: <10 ug/mL — ABNORMAL LOW (ref 10–30)

## 2021-07-19 LAB — ETHANOL: Alcohol, Ethyl (B): <10 mg/dL (ref <10)

## 2021-07-19 MED ORDER — MIRTAZAPINE 15 MG PO TABS
15.0000 mg | ORAL_TABLET | Freq: Every day | ORAL | Status: DC
  Administered 2021-07-20: 15 mg via ORAL
  Filled 2021-07-19 (×2): qty 1

## 2021-07-19 MED ORDER — CLONAZEPAM 0.5 MG PO TABS
2.0000 mg | ORAL_TABLET | Freq: Two times a day (BID) | ORAL | Status: DC
  Administered 2021-07-20: 2 mg via ORAL
  Filled 2021-07-19: qty 4

## 2021-07-19 MED ORDER — LAMOTRIGINE 150 MG PO TABS
300.0000 mg | ORAL_TABLET | Freq: Every day | ORAL | Status: DC
  Administered 2021-07-20: 300 mg via ORAL
  Filled 2021-07-19 (×2): qty 2

## 2021-07-19 MED ORDER — LURASIDONE HCL 40 MG PO TABS
120.0000 mg | ORAL_TABLET | Freq: Every day | ORAL | Status: DC
  Administered 2021-07-20: 120 mg via ORAL
  Filled 2021-07-19 (×2): qty 3

## 2021-07-19 MED ORDER — AMPHETAMINE-DEXTROAMPHET ER 10 MG PO CP24
30.0000 mg | ORAL_CAPSULE | Freq: Every morning | ORAL | Status: DC
  Administered 2021-07-20: 30 mg via ORAL
  Filled 2021-07-19: qty 3
  Filled 2021-07-19: qty 1

## 2021-07-19 MED ORDER — PRAZOSIN HCL 2 MG PO CAPS
10.0000 mg | ORAL_CAPSULE | Freq: Every day | ORAL | Status: DC
  Administered 2021-07-20: 10 mg via ORAL
  Filled 2021-07-19 (×2): qty 5

## 2021-07-19 MED ORDER — ZOLPIDEM TARTRATE 5 MG PO TABS
5.0000 mg | ORAL_TABLET | Freq: Every evening | ORAL | Status: DC | PRN
  Administered 2021-07-20: 5 mg via ORAL
  Filled 2021-07-19: qty 1

## 2021-07-19 MED ORDER — NICOTINE 21 MG/24HR TD PT24
21.0000 mg | MEDICATED_PATCH | Freq: Every day | TRANSDERMAL | Status: DC | PRN
Start: 2021-07-19 — End: 2021-07-20

## 2021-07-19 MED ORDER — ATOMOXETINE HCL 60 MG PO CAPS
60.0000 mg | ORAL_CAPSULE | Freq: Every morning | ORAL | Status: DC
  Administered 2021-07-20: 60 mg via ORAL
  Filled 2021-07-19: qty 1

## 2021-07-19 MED ORDER — IBUPROFEN 800 MG PO TABS
800.0000 mg | ORAL_TABLET | Freq: Four times a day (QID) | ORAL | Status: DC | PRN
  Administered 2021-07-20: 800 mg via ORAL
  Filled 2021-07-19: qty 1

## 2021-07-19 MED ORDER — ACETAMINOPHEN 325 MG PO TABS: 650.0000 mg | ORAL_TABLET | ORAL | Status: DC | PRN

## 2021-07-19 MED ORDER — METHOCARBAMOL 500 MG PO TABS: 1000.0000 mg | ORAL_TABLET | Freq: Three times a day (TID) | ORAL | Status: DC | PRN

## 2021-07-19 NOTE — ED Provider Notes (Signed)
[facility] EMERGENCY DEPARTMENT Provider Note   CSN: [id number] Arrival date & time: 07/19/21  1723     History Chief Complaint  Patient presents with   Psychiatric Evaluation    Rebekah Cunningham is a 40 y.o. female with a hx of Bipolar 1 disorder, UC, & insomnia who presents to the ED from [facility] under IVC.  Per IVC paperwork: "Guest is manic. She is paranoid + delusional despite medications. Threatening others, no insight. Acute threat to self and others."   Patient states she does not feel she needs to be here. She describes an encounter during group therapy where another patient got upset with her, she relays she tried to rectify this and talk more with the therapist and felt as though she was dismissed. She talked with another therapist who talked her through the situation. She states she then returned to group and was subsequently told she was being IVCd. She denies SI, HI, or hallucinations. She states she is in rehab - just reached 28 days clear recently.    HPI     Past Medical History:  Diagnosis Date   Bipolar 1 disorder, depressed (HCC)    C. difficile colitis    Insomnia    Ulcerative colitis (HCC)     Patient Active Problem List   Diagnosis Date Noted   Acute diarrhea 10/15/2018    Past Surgical History:  Procedure Laterality Date   BREAST ENHANCEMENT SURGERY     DILATION AND CURETTAGE OF UTERUS       OB History   No obstetric history on file.     History reviewed. No pertinent family history.  Social History   Tobacco Use   Smoking status: Every Day    Packs/day: 0.50    Types: Cigarettes   Smokeless tobacco: Never  Vaping Use   Vaping Use: Never used  Substance Use Topics   Alcohol use: Not Currently   Drug use: Never    Home Medications Prior to Admission medications   Medication Sig Start Date End Date Taking? Authorizing Provider  amphetamine-dextroamphetamine (ADDERALL XR) 30 MG 24 hr capsule Take 30 mg by  mouth every morning. 06/09/21  Yes [provider]  atomoxetine (STRATTERA) 60 MG capsule Take 60 mg by mouth every morning. 07/12/21  Yes [provider]  clonazePAM (KLONOPIN) 1 MG tablet Take 2 mg by mouth 2 (two) times daily. 06/10/21  Yes [provider]  ibuprofen (ADVIL) 200 MG tablet Take 800 mg by mouth every 6 (six) hours as needed for headache or moderate pain.   Yes [provider]  lamoTRIgine (LAMICTAL) 150 MG tablet Take 300 mg by mouth at bedtime. 04/25/21  Yes [provider]  LATUDA 120 MG TABS Take 1 tablet by mouth at bedtime. 07/13/21  Yes [provider]  methocarbamol (ROBAXIN) 500 MG tablet Take 1,000 mg by mouth 3 (three) times daily as needed for muscle spasms. 07/12/21  Yes [provider]  mirtazapine (REMERON) 15 MG tablet Take 15 mg by mouth at bedtime. 07/12/21  Yes [provider]  prazosin (MINIPRESS) 5 MG capsule Take 10 mg by mouth at bedtime. 07/02/20  Yes [provider]  zolpidem (AMBIEN CR) 12.5 MG CR tablet Take 12.5 mg by mouth at bedtime. 01/28/19  Yes [provider]  ondansetron (ZOFRAN-ODT) 4 MG disintegrating tablet Take 1 tablet (4 mg total) by mouth every 8 (eight) hours as needed for nausea or vomiting. Patient not taking: No sig reported  10/15/18   Eulah Pont, MD    Allergies    Patient has no known allergies.  Review of Systems   Review of Systems  Constitutional:  Negative for chills and fever.  Respiratory:  Negative for shortness of breath.   Cardiovascular:  Negative for chest pain.  Gastrointestinal:  Negative for abdominal pain, diarrhea, nausea and vomiting.  Neurological:  Negative for syncope.  Psychiatric/Behavioral:  Negative for hallucinations and suicidal ideas.   All other systems reviewed and are negative.  Physical Exam Updated Vital Signs BP 127/89 (BP Location: Left Arm)   Pulse 91   Temp 98.6 F (37 C) (Oral)   Resp 16   SpO2 98%    Physical Exam Vitals and nursing note reviewed.  Constitutional:      General: She is not in acute distress.    Appearance: She is well-developed. She is not toxic-appearing.  HENT:     Head: Normocephalic and atraumatic.  Eyes:     General:        Right eye: No discharge.        Left eye: No discharge.     Conjunctiva/sclera: Conjunctivae normal.  Cardiovascular:     Rate and Rhythm: Normal rate and regular rhythm.  Pulmonary:     Effort: Pulmonary effort is normal. No respiratory distress.     Breath sounds: Normal breath sounds. No wheezing, rhonchi or rales.  Abdominal:     General: There is no distension.     Palpations: Abdomen is soft.     Tenderness: There is no abdominal tenderness.  Musculoskeletal:     Cervical back: Neck supple.  Skin:    General: Skin is warm and dry.     Findings: No rash.  Neurological:     Mental Status: She is alert.     Comments: Clear speech.   Psychiatric:        Behavior: Behavior normal.    ED Results / Procedures / Treatments   Labs (all labs ordered are listed, but only abnormal results are displayed) Labs Reviewed  COMPREHENSIVE METABOLIC PANEL - Abnormal; Notable for the following components:      Result Value   CO2 20 (*)    AST 13 (*)    All other components within normal limits  SALICYLATE LEVEL - Abnormal; Notable for the following components:   Salicylate Lvl <7.0 (*)    All other components within normal limits  ACETAMINOPHEN LEVEL - Abnormal; Notable for the following components:   Acetaminophen (Tylenol), Serum <10 (*)    All other components within normal limits  CBC - Abnormal; Notable for the following components:   WBC 11.7 (*)    All other components within normal limits  ETHANOL  RAPID URINE DRUG SCREEN, HOSP PERFORMED  I-STAT BETA HCG BLOOD, ED ([facility], [facility], [facility] ONLY)    EKG None  Radiology No results found.  Procedures Procedures   Medications Ordered in ED Medications - No data to display  ED  Course  I have reviewed the triage vital signs and the nursing notes.  Pertinent labs & imaging results that were available during my care of the patient were reviewed by me and considered in my medical decision making (see chart for details).    MDM Rules/Calculators/A&P                           Patient presents to the ED for behavioral health assessment under IVC by [facility]. Nontoxic, vitals unremarkable on arrival.   Additional history obtained:  Additional history obtained from chart review & nursing note review.   Lab Tests:  Screening labs have been reviewed including CBC, CMP, acetaminophen/salicylate/ethanol level, UDS fairly unremarkable.   ED Course:  Hypotension on automated BP 79/58, manual is 110/70, patient asymptomatic & ambulatory favor initial BP to be inaccurate. Patient is medically cleared. Consult placed to TTS. Disposition per [facility].   The patient has been placed in psychiatric observation due to the need to provide a safe environment for the patient while obtaining psychiatric consultation and evaluation, as well as ongoing medical and medication management to treat the patient's condition.  The patient has been placed under full IVC at this time.  Portions of this note were generated with Scientist, clinical (histocompatibility and immunogenetics). Dictation errors may occur despite best attempts at proofreading.  Final Clinical Impression(s) / ED Diagnoses Final diagnoses:  None    Rx / DC Orders ED Discharge Orders     None        Cherly Anderson, PA-C 07/20/21 1030    Blane Ohara, MD 07/25/21 2330

## 2021-07-19 NOTE — ED Notes (Addendum)
Pt BP 79/58 MAP 66. RN notified. Pt refused to have BP rechecked. Pt said "the cuff is too tight, I am not having this"

## 2021-07-19 NOTE — ED Notes (Signed)
Pt anxious and talking on phone with husband. Pt asking for him to come stay with her. Due to IVC paperwork being in place, no visitors at this time. No acute changes noted. Will continue to monitor.

## 2021-07-19 NOTE — ED Triage Notes (Signed)
Pt states she was at [facility] for a recovery meeting and was asked for a personal one on one session. Pt states she was talking with counselor and then counselor left and called for PD. [facility] IVC paperwork states that pt is in a manic state and threatening others.  Pt denies SI/HI/auditory or visual hallucinations. Pt calm and cooperative in triage, states she is bipolar and has been taking her medication without missing a dose.

## 2021-07-19 NOTE — ED Provider Notes (Signed)
Emergency Medicine Provider Triage Evaluation Note  Rebekah Cunningham , a 40 y.o. female  was evaluated in triage.  Pt complains of IVC by [facility].  Per traige RN note by RN crystal Black ."Pt states she was at [facility] for a recovery meeting and was asked for a personal one on one session. Pt states she was talking with counselor and then counselor left and called for PD. [facility] IVC paperwork states that pt is in a manic state and threatening others.  Pt denies SI/HI/auditory or visual hallucinations. Pt calm and cooperative in triage, states she is bipolar and has been taking her medication without missing a dose."  Patient has been calm and cooperative with the officers per their report.    Patient tells me that she is not in a manic episode.  She says that when that is the case her heart rate and blood pressure both go up.  She is calm, articulate and cooperative when I speak with her.  Review of Systems  Positive: IVC Negative: SI, HI, AVH  Physical Exam  There were no vitals taken for this visit. Gen:   Awake, no distress   Resp:  Normal effort  MSK:   Moves extremities without difficulty  Other:  Patient is calm and cooperative.   Medical Decision Making  Medically screening exam initiated at 8:37 PM.  Appropriate orders placed.  Rebekah Cunningham was informed that the remainder of the evaluation will be completed by another provider, this initial triage assessment does not replace that evaluation, and the importance of remaining in the ED until their evaluation is complete.  Patient is calm and cooperative.  She is willing to get blood work for medical clearance.  Note: Portions of this report may have been transcribed using voice recognition software. Every effort was made to ensure accuracy; however, inadvertent computerized transcription errors may be present    Rebekah Cunningham 07/19/21 2049    Benjiman Core, MD 07/19/21 [phone]

## 2021-07-19 NOTE — ED Notes (Signed)
PA Petrucelli at bedside. 

## 2021-07-20 LAB — RESP PANEL BY RT-PCR (FLU A&B, COVID) ARPGX2
Influenza A by PCR: NEGATIVE
Influenza B by PCR: NEGATIVE
SARS Coronavirus 2 by RT PCR: NEGATIVE

## 2021-07-20 NOTE — Progress Notes (Signed)
CSW contacted the Patient's husband Homero Fellers via phone regarding safety planning but there was no answer. CSW left a voice message asking the Pt to return the call.   Damita Dunnings, MSW, LCSW-A  11:00 AM 07/20/2021

## 2021-07-20 NOTE — ED Provider Notes (Signed)
Emergency Medicine Observation Re-evaluation Note  Rebekah Cunningham is a 40 y.o. female, seen on rounds today.  Pt initially presented to the ED for complaints of Psychiatric Evaluation Currently, the patient is resting in bed  Physical Exam  BP 99/63   Pulse 93   Temp 98.5 F (36.9 C) (Oral)   Resp 18   SpO2 100%  Physical Exam General:  awake and alert Lungs: resp even and unlabored Psych: calm and cooperative  ED Course / MDM  EKG:   I have reviewed the labs performed to date as well as medications administered while in observation.  Recent changes in the last 24 hours include none.  Plan  Current plan is for psych evaluation.  Rebekah Cunningham is not under involuntary commitment.     Pollyann Savoy, MD 07/20/21 [phone]

## 2021-07-20 NOTE — ED Notes (Signed)
Pt ambulatory to room 4 to complete telepsych eval. Pt complaining of feeling dizzy and given apple juice. Will continue to monitor.

## 2021-07-20 NOTE — ED Notes (Signed)
Pt given graham crackers and apple juice  

## 2021-07-20 NOTE — ED Notes (Signed)
Pt given graham crackers and apple juice per request. No acute changes noted. Will continue to monitor.

## 2021-07-20 NOTE — ED Notes (Signed)
Pt TTS completed. Pt ambulated back to bed with no assistance. Pt states that juice helped with her dizziness. Pt now resting.
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
## 2021-07-20 NOTE — Discharge Instructions (Addendum)
It was our pleasure to provide your ER care today - we hope that you feel better. (Patient is NOT under IVC, and is free to go).  Follow up with primary care doctor, as well as with rehab and counseling programs in the coming week.   Return to ER if worse, new symptoms, fevers, trouble breathing, or other emergency concern.

## 2021-07-20 NOTE — ED Notes (Signed)
Pt husband number - [phone]

## 2021-07-20 NOTE — ED Provider Notes (Signed)
Emergency Medicine Observation Re-evaluation Note  Rebekah Cunningham is a 40 y.o. female, seen on rounds today.  Pt initially presented to the ED for complaints of Psychiatric Evaluation Pt indicates was at [facility] for past 1-2 months, became upset about something that happened there/raised issue, and then states was referred to ED for [facility] eval and IVC taken out.  Pt denies any thoughts of harm to self, no SI. No delusions or hallucinations. Denies physical c/o or new symptoms. States her current plan is to pursue other rehab options. Pt indicates does not feel would benefit from continued ED stay and/or inpatient [facility] stay.  Physical Exam  BP 99/63   Pulse 93   Temp 98.5 F (36.9 C) (Oral)   Resp 18   SpO2 100%  Physical Exam General: calm, cooperative, conversant.  Cardiac: regular rate.  Lungs: breathing comfortably.  Psych: normal mood and affect, pleasant, conversant. Pt does not appear depressed or despondent. She does not appear angry or hostile. She voices no thoughts of harm to self or others.  No SI. Pt is not responding to internal stimuli. No delusions or hallucinations. Her thought processes appear clear.   ED Course / MDM    I have reviewed the labs performed to date as well as medications administered while in observation.  Recent changes in the last 24 hours include ED observation and reassessment.   Plan  Pt w normal mood/affect, no SI/HI. No acute psychosis.   Pt indicates optimistic about pursuing new rehab plan, and indicates spouse supportive in her efforts.   Pt does not feel would benefit from ED or inpatient stay, and no indication to hold her under IVC.  Pt currently appears stable for d/c.   Return precautions provided.        Cathren Laine, MD 07/20/21 [phone]

## 2021-07-20 NOTE — ED Notes (Signed)
Pt wanded by security at this time  ?

## 2021-07-20 NOTE — ED Notes (Signed)
Pt on phone with husband at this time.

## 2021-07-20 NOTE — [facility] Suicide Risk Assessment (Signed)
[facility] INPATIENT:  Family/Significant Other Suicide Prevention Education  Suicide Prevention Education:  Education Completed; Naidelyn Parrella, (patient's husband) has been identified by the patient as the family member/significant other with whom the patient will be residing, and identified as the person(s) who will aid the patient in the event of a mental health crisis (suicidal ideations/suicide attempt).  With written consent from the patient, the family member/significant other has been provided the following suicide prevention education, prior to the and/or following the discharge of the patient.  CSW spoke with the patient's husband Homero Fellers via phone regarding safety concerns and local resources within the area. Homero Fellers confirmed having firearms and medications in a secure area and has stated that he is the only person with access to these items. Homero Fellers also confirmed being familiar with mobile crisis units and behavioral health urgent cares for additional support during a crisis. Homero Fellers reports being comfortable taking the patient home.   The suicide prevention education provided includes the following: Suicide risk factors Suicide prevention and interventions National Suicide Hotline telephone number [facility] assessment telephone number [facility] Emergency Assistance [facility] and/or Residential Mobile Crisis Unit telephone number  Request made of family/significant other to: Remove weapons (e.g., guns, rifles, knives), all items previously/currently identified as safety concern.   Remove drugs/medications (over-the-counter, prescriptions, illicit drugs), all items previously/currently identified as a safety concern.  The family member/significant other verbalizes understanding of the suicide prevention education information provided.  The family member/significant other agrees to remove the items of safety concern listed above.  Shatima Zalar L Jakita Dutkiewicz 07/20/2021, 1:53 PM

## 2023-11-27 DIAGNOSIS — J159 Unspecified bacterial pneumonia: Secondary | ICD-10-CM

## 2023-11-27 DIAGNOSIS — Z930 Tracheostomy status: Secondary | ICD-10-CM | POA: Diagnosis not present

## 2023-11-27 DIAGNOSIS — Y95 Nosocomial condition: Secondary | ICD-10-CM | POA: Diagnosis not present

## 2023-11-27 DIAGNOSIS — G610 Guillain-Barre syndrome: Secondary | ICD-10-CM | POA: Diagnosis not present

## 2023-11-27 DIAGNOSIS — J9621 Acute and chronic respiratory failure with hypoxia: Secondary | ICD-10-CM | POA: Diagnosis not present

## 2023-11-28 DIAGNOSIS — Z930 Tracheostomy status: Secondary | ICD-10-CM | POA: Diagnosis not present

## 2023-11-28 DIAGNOSIS — J159 Unspecified bacterial pneumonia: Secondary | ICD-10-CM

## 2023-11-28 DIAGNOSIS — G610 Guillain-Barre syndrome: Secondary | ICD-10-CM | POA: Diagnosis not present

## 2023-11-28 DIAGNOSIS — J9621 Acute and chronic respiratory failure with hypoxia: Secondary | ICD-10-CM | POA: Diagnosis not present

## 2023-11-28 DIAGNOSIS — Y95 Nosocomial condition: Secondary | ICD-10-CM | POA: Diagnosis not present

## 2023-11-29 DIAGNOSIS — J159 Unspecified bacterial pneumonia: Secondary | ICD-10-CM

## 2023-11-29 DIAGNOSIS — Y95 Nosocomial condition: Secondary | ICD-10-CM | POA: Diagnosis not present

## 2023-11-29 DIAGNOSIS — J9621 Acute and chronic respiratory failure with hypoxia: Secondary | ICD-10-CM | POA: Diagnosis not present

## 2023-11-29 DIAGNOSIS — G610 Guillain-Barre syndrome: Secondary | ICD-10-CM | POA: Diagnosis not present

## 2023-11-29 DIAGNOSIS — Z930 Tracheostomy status: Secondary | ICD-10-CM | POA: Diagnosis not present

## 2023-11-30 DIAGNOSIS — Z930 Tracheostomy status: Secondary | ICD-10-CM | POA: Diagnosis not present

## 2023-11-30 DIAGNOSIS — J9621 Acute and chronic respiratory failure with hypoxia: Secondary | ICD-10-CM | POA: Diagnosis not present

## 2023-11-30 DIAGNOSIS — J159 Unspecified bacterial pneumonia: Secondary | ICD-10-CM

## 2023-11-30 DIAGNOSIS — G610 Guillain-Barre syndrome: Secondary | ICD-10-CM | POA: Diagnosis not present

## 2023-11-30 DIAGNOSIS — Y95 Nosocomial condition: Secondary | ICD-10-CM | POA: Diagnosis not present

## 2023-12-01 DIAGNOSIS — J9621 Acute and chronic respiratory failure with hypoxia: Secondary | ICD-10-CM | POA: Diagnosis not present

## 2023-12-01 DIAGNOSIS — G610 Guillain-Barre syndrome: Secondary | ICD-10-CM | POA: Diagnosis not present

## 2023-12-01 DIAGNOSIS — Y95 Nosocomial condition: Secondary | ICD-10-CM | POA: Diagnosis not present

## 2023-12-01 DIAGNOSIS — J159 Unspecified bacterial pneumonia: Secondary | ICD-10-CM

## 2023-12-01 DIAGNOSIS — Z930 Tracheostomy status: Secondary | ICD-10-CM | POA: Diagnosis not present
